# Patient Record
Sex: Male | Born: 1985 | Race: Black or African American | Hispanic: No | Marital: Single | Smoking: Former smoker
Health system: Southern US, Community
[De-identification: ages and names within clinical notes are randomized; demographics above are authoritative.]

## PROBLEM LIST (undated history)

## (undated) DIAGNOSIS — S060X9A Concussion with loss of consciousness of unspecified duration, initial encounter: Secondary | ICD-10-CM

## (undated) HISTORY — PX: NO PAST SURGERIES: SHX2092

---
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
## 2010-08-07 ENCOUNTER — Emergency Department (HOSPITAL_COMMUNITY)
Admission: EM | Admit: 2010-08-07 | Discharge: 2010-08-07 | Payer: Self-pay | Source: Home / Self Care | Admitting: Family Medicine

## 2010-10-21 LAB — POCT URINALYSIS DIPSTICK
Glucose, UA: NEGATIVE mg/dL
Hgb urine dipstick: NEGATIVE
Nitrite: NEGATIVE
Protein, ur: NEGATIVE mg/dL
Urobilinogen, UA: 1 mg/dL (ref 0.0–1.0)

## 2010-12-21 ENCOUNTER — Emergency Department (HOSPITAL_COMMUNITY)
Admission: EM | Admit: 2010-12-21 | Discharge: 2010-12-21 | Disposition: A | Discharge status: Home / Self Care | Payer: Self-pay | Attending: Emergency Medicine | Admitting: Emergency Medicine

## 2010-12-21 DIAGNOSIS — R369 Urethral discharge, unspecified: Secondary | ICD-10-CM | POA: Insufficient documentation

## 2010-12-21 DIAGNOSIS — N342 Other urethritis: Secondary | ICD-10-CM | POA: Insufficient documentation

## 2010-12-21 LAB — URINALYSIS, ROUTINE W REFLEX MICROSCOPIC
Bilirubin Urine: NEGATIVE
Glucose, UA: NEGATIVE mg/dL
Hgb urine dipstick: NEGATIVE
Ketones, ur: NEGATIVE mg/dL
Protein, ur: NEGATIVE mg/dL
Urobilinogen, UA: 0.2 mg/dL (ref 0.0–1.0)

## 2010-12-22 LAB — URINE CULTURE
Colony Count: NO GROWTH
Culture: NO GROWTH

## 2010-12-23 LAB — GC/CHLAMYDIA PROBE AMP, GENITAL
Chlamydia, DNA Probe: NEGATIVE
GC Probe Amp, Genital: NEGATIVE

## 2012-01-18 ENCOUNTER — Emergency Department (HOSPITAL_BASED_OUTPATIENT_CLINIC_OR_DEPARTMENT_OTHER)
Admission: EM | Admit: 2012-01-18 | Discharge: 2012-01-18 | Disposition: A | Discharge status: Home / Self Care | Payer: Self-pay | Attending: Emergency Medicine | Admitting: Emergency Medicine

## 2012-01-18 ENCOUNTER — Encounter (HOSPITAL_BASED_OUTPATIENT_CLINIC_OR_DEPARTMENT_OTHER): Payer: Self-pay | Admitting: Emergency Medicine

## 2012-01-18 DIAGNOSIS — T1590XA Foreign body on external eye, part unspecified, unspecified eye, initial encounter: Secondary | ICD-10-CM | POA: Insufficient documentation

## 2012-01-18 DIAGNOSIS — S058X9A Other injuries of unspecified eye and orbit, initial encounter: Secondary | ICD-10-CM | POA: Insufficient documentation

## 2012-01-18 DIAGNOSIS — S0502XA Injury of conjunctiva and corneal abrasion without foreign body, left eye, initial encounter: Secondary | ICD-10-CM

## 2012-01-18 DIAGNOSIS — H1130 Conjunctival hemorrhage, unspecified eye: Secondary | ICD-10-CM | POA: Insufficient documentation

## 2012-01-18 MED ORDER — TETRACAINE HCL 0.5 % OP SOLN
1.0000 [drp] | Freq: Once | OPHTHALMIC | Status: DC
  Filled 2012-01-18: qty 2

## 2012-01-18 MED ORDER — POLYMYXIN B-TRIMETHOPRIM 10000-0.1 UNIT/ML-% OP SOLN: 2.0000 [drp] | Freq: Four times a day (QID) | OPHTHALMIC | Status: AC

## 2012-01-18 MED ORDER — POLYMYXIN B-TRIMETHOPRIM 10000-0.1 UNIT/ML-% OP SOLN
2.0000 [drp] | Freq: Four times a day (QID) | OPHTHALMIC | Status: DC
  Administered 2012-01-18: 2 [drp] via OPHTHALMIC
  Filled 2012-01-18: qty 10

## 2012-01-18 MED ORDER — FLUORESCEIN SODIUM 1 MG OP STRP
1.0000 | ORAL_STRIP | Freq: Once | OPHTHALMIC | Status: DC
  Filled 2012-01-18: qty 1
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
## 2012-02-03 ENCOUNTER — Emergency Department (HOSPITAL_BASED_OUTPATIENT_CLINIC_OR_DEPARTMENT_OTHER)
Admission: EM | Admit: 2012-02-03 | Discharge: 2012-02-03 | Disposition: A | Discharge status: Home / Self Care | Payer: Self-pay | Attending: Emergency Medicine | Admitting: Emergency Medicine

## 2012-02-03 ENCOUNTER — Encounter (HOSPITAL_BASED_OUTPATIENT_CLINIC_OR_DEPARTMENT_OTHER): Payer: Self-pay | Admitting: Emergency Medicine

## 2012-02-03 DIAGNOSIS — W260XXA Contact with knife, initial encounter: Secondary | ICD-10-CM | POA: Insufficient documentation

## 2012-02-03 DIAGNOSIS — S61219A Laceration without foreign body of unspecified finger without damage to nail, initial encounter: Secondary | ICD-10-CM

## 2012-02-03 DIAGNOSIS — S61209A Unspecified open wound of unspecified finger without damage to nail, initial encounter: Secondary | ICD-10-CM | POA: Insufficient documentation

## 2012-02-03 MED ORDER — TETANUS-DIPHTH-ACELL PERTUSSIS 5-2.5-18.5 LF-MCG/0.5 IM SUSP
0.5000 mL | Freq: Once | INTRAMUSCULAR | Status: AC
  Administered 2012-02-03: 0.5 mL via INTRAMUSCULAR
  Filled 2012-02-03: qty 0.5
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
## 2014-11-07 ENCOUNTER — Encounter (HOSPITAL_BASED_OUTPATIENT_CLINIC_OR_DEPARTMENT_OTHER): Payer: Self-pay | Admitting: *Deleted

## 2014-11-07 ENCOUNTER — Emergency Department (HOSPITAL_BASED_OUTPATIENT_CLINIC_OR_DEPARTMENT_OTHER)
Admission: EM | Admit: 2014-11-07 | Discharge: 2014-11-07 | Disposition: A | Discharge status: Home / Self Care | Payer: Self-pay | Attending: Emergency Medicine | Admitting: Emergency Medicine

## 2014-11-07 DIAGNOSIS — R112 Nausea with vomiting, unspecified: Secondary | ICD-10-CM | POA: Insufficient documentation

## 2014-11-07 DIAGNOSIS — K0381 Cracked tooth: Secondary | ICD-10-CM | POA: Insufficient documentation

## 2014-11-07 DIAGNOSIS — K088 Other specified disorders of teeth and supporting structures: Secondary | ICD-10-CM | POA: Insufficient documentation

## 2014-11-07 DIAGNOSIS — K029 Dental caries, unspecified: Secondary | ICD-10-CM | POA: Insufficient documentation

## 2014-11-07 MED ORDER — TRAMADOL HCL 50 MG PO TABS: 50.0000 mg | ORAL_TABLET | Freq: Four times a day (QID) | ORAL | Status: DC | PRN

## 2014-11-07 MED ORDER — IBUPROFEN 600 MG PO TABS: 600.0000 mg | ORAL_TABLET | Freq: Four times a day (QID) | ORAL | Status: DC | PRN

## 2014-11-07 MED ORDER — AMOXICILLIN 500 MG PO CAPS: 500.0000 mg | ORAL_CAPSULE | Freq: Three times a day (TID) | ORAL | Status: DC

## 2014-11-07 NOTE — ED Provider Notes (Signed)
CSN: [id number]     Arrival date & time 11/07/14  1207 History   First MD Initiated Contact with Patient 11/07/14 1227     Chief Complaint  Patient presents with  . Dental Pain     (Consider location/radiation/quality/duration/timing/severity/associated sxs/prior Treatment) HPI  Pt is a 29yo male presenting to ED with c/o gradually worsening Right lower dental pain that started 3 days ago.  Pain is constant, aching and throbbing, 10/10 at worst. Denies relief with tylenol, motrin, or other "pain pill" provided by a friend's mother. Pt believes it was vicodin but states it only made him "woosy" but did not take the pain away. Reports nausea and vomiting x3 yesterday. Denies fever or chills. Pt does not have a dentist. No dental injury. No difficulty breathing or swallowing.  History reviewed. No pertinent past medical history. History reviewed. No pertinent past surgical history. No family history on file. History  Substance Use Topics  . Smoking status: Never Smoker   . Smokeless tobacco: Never Used  . Alcohol Use: No    Review of Systems  Constitutional: Negative for fever and chills.  HENT: Positive for dental problem. Negative for sore throat, trouble swallowing and voice change.   Gastrointestinal: Positive for nausea and vomiting. Negative for abdominal pain.  Musculoskeletal: Negative for neck pain and neck stiffness.  All other systems reviewed and are negative.     Allergies  Review of patient's allergies indicates no known allergies.  Home Medications   Prior to Admission medications   Medication Sig Start Date End Date Taking? Authorizing Provider  amoxicillin (AMOXIL) 500 MG capsule Take 1 capsule (500 mg total) by mouth 3 (three) times daily. 11/07/14   Junius Finner, PA-C  ibuprofen (ADVIL,MOTRIN) 600 MG tablet Take 1 tablet (600 mg total) by mouth every 6 (six) hours as needed. 11/07/14   Junius Finner, PA-C  traMADol (ULTRAM) 50 MG tablet Take 1 tablet (50 mg  total) by mouth every 6 (six) hours as needed. 11/07/14   Junius Finner, PA-C   BP 146/96 mmHg  Pulse 88  Temp(Src) 99.4 F (37.4 C) (Oral)  Resp 18  Ht  (1.778 m)  Wt 160 lb (72.576 kg)  BMI 22.96 kg/m2  SpO2 98% Physical Exam  Constitutional: He is oriented to person, place, and time. He appears well-developed and well-nourished.  HENT:  Head: Normocephalic and atraumatic.  Right Ear: Hearing, tympanic membrane, external ear and ear canal normal.  Left Ear: Hearing, tympanic membrane, external ear and ear canal normal.  Nose: Nose normal.  Mouth/Throat: Uvula is midline, oropharynx is clear and moist and mucous membranes are normal. No trismus in the jaw. Abnormal dentition. Dental caries present. No dental abscesses or uvula swelling.    Right lower wisdom tooth partially impacted with chipped second molar rubbing against buccal mucosa causing tenderness. No bleeding or discharge. No airway involvement. No gingival abscess.   Eyes: EOM are normal.  Neck: Normal range of motion. Neck supple.  Cardiovascular: Normal rate.   Pulmonary/Chest: Effort normal.  Musculoskeletal: Normal range of motion.  Neurological: He is alert and oriented to person, place, and time.  Skin: Skin is warm and dry.  Psychiatric: He has a normal mood and affect. His behavior is normal.  Nursing note and vitals reviewed.   ED Course  Procedures (including critical care time) Labs Review Labs Reviewed - No data to display  Imaging Review No results found.   EKG Interpretation None      MDM  Final diagnoses:  Pain due to dental caries  Cracked tooth    Dental pain due to dental carrie, impacted Right lower wisdom tooth and cracked Right lower second molar. No gingival abscess. Will tx with amoxicillin, tramadol and ibuprofen. Home care instructions provided. Advised to f/u with Dr. Lucky CowboyKnox, DDS, for further evaluation and treatment. Dental resource guide also provided. Return precautions  provided. Pt verbalized understanding and agreement with tx plan.    Junius Finnerrin O'Malley, PA-C 11/07/14 1316  Vanetta MuldersScott Zackowski, MD 11/13/14 2350

## 2014-11-07 NOTE — ED Notes (Signed)
Dental pain x 3 days.  

## 2014-11-07 NOTE — Discharge Instructions (Signed)
Dental Care and Dentist Visits Dental care supports good overall health. Regular dental visits can also help you avoid dental pain, bleeding, infection, and other more serious health problems in the future. It is important to keep the mouth healthy because diseases in the teeth, gums, and other oral tissues can spread to other areas of the body. Some problems, such as diabetes, heart disease, and pre-term labor have been associated with poor oral health.  See your dentist every 6 months. If you experience emergency problems such as a toothache or broken tooth, go to the dentist right away. If you see your dentist regularly, you may catch problems early. It is easier to be treated for problems in the early stages.  WHAT TO EXPECT AT A DENTIST VISIT  Your dentist will look for many common oral health problems and recommend proper treatment. At your regular dental visit, you can expect:  Gentle cleaning of the teeth and gums. This includes scraping and polishing. This helps to remove the sticky substance around the teeth and gums (plaque). Plaque forms in the mouth shortly after eating. Over time, plaque hardens on the teeth as tartar. If tartar is not removed regularly, it can cause problems. Cleaning also helps remove stains.  Periodic X-rays. These pictures of the teeth and supporting bone will help your dentist assess the health of your teeth.  Periodic fluoride treatments. Fluoride is a natural mineral shown to help strengthen teeth. Fluoride treatmentinvolves applying a fluoride gel or varnish to the teeth. It is most commonly done in children.  Examination of the mouth, tongue, jaws, teeth, and gums to look for any oral health problems, such as:  Cavities (dental caries). This is decay on the tooth caused by plaque, sugar, and acid in the mouth. It is best to catch a cavity when it is small.  Inflammation of the gums caused by plaque buildup (gingivitis).  Problems with the mouth or malformed  or misaligned teeth.  Oral cancer or other diseases of the soft tissues or jaws. KEEP YOUR TEETH AND GUMS HEALTHY For healthy teeth and gums, follow these general guidelines as well as your dentist's specific advice:  Have your teeth professionally cleaned at the dentist every 6 months.  Brush twice daily with a fluoride toothpaste.  Floss your teeth daily.  Ask your dentist if you need fluoride supplements, treatments, or fluoride toothpaste.  Eat a healthy diet. Reduce foods and drinks with added sugar.  Avoid smoking. TREATMENT FOR ORAL HEALTH PROBLEMS If you have oral health problems, treatment varies depending on the conditions present in your teeth and gums.  Your caregiver will most likely recommend good oral hygiene at each visit.  For cavities, gingivitis, or other oral health disease, your caregiver will perform a procedure to treat the problem. This is typically done at a separate appointment. Sometimes your caregiver will refer you to another dental specialist for specific tooth problems or for surgery. SEEK IMMEDIATE DENTAL CARE IF:  You have pain, bleeding, or soreness in the gum, tooth, jaw, or mouth area.  A permanent tooth becomes loose or separated from the gum socket.  You experience a blow or injury to the mouth or jaw area. Document Released: 04/09/2011 Document Revised: 10/20/2011 Document Reviewed: 04/09/2011 [facility] Patient Information 2015 [place], [place]. This information is not intended to replace advice given to you by your health care provider. Make sure you discuss any questions you have with your health care provider.  Dental Caries Dental caries is tooth decay. This  decay can cause a hole in teeth (cavity) that can get bigger and deeper over time. HOME CARE  Brush and floss your teeth. Do this at least two times a day.  Use a fluoride toothpaste.  Use a mouth rinse if told by your dentist or doctor.  Eat less sugary and starchy foods.  Drink less sugary drinks.  Avoid snacking often on sugary and starchy foods. Avoid sipping often on sugary drinks.  Keep regular checkups and cleanings with your dentist.  Use fluoride supplements if told by your dentist or doctor.  Allow fluoride to be applied to teeth if told by your dentist or doctor. Document Released: 05/06/2008 Document Revised: 12/12/2013 Document Reviewed: 07/30/2012 [facility] Patient Information 2015 [place], [place]. This information is not intended to replace advice given to you by your health care provider. Make sure you discuss any questions you have with your health care provider.  Dental Fracture You have a dental fracture or injury. This can mean the tooth is loose, has a chip in the enamel or is broken. If just the outer enamel is chipped, there is a good chance the tooth will not become infected. The only treatment needed may be to smooth off a rough edge. Fractures into the deeper layers (dentin and pulp) cause greater pain and are more likely to become infected. These require you to see a dentist as soon as possible to save the tooth. Loose teeth may need to be wired or bonded with a plastic splint to hold them in place. A paste may be painted on the open area of the broken tooth to reduce the pain. Antibiotics and pain medicine may be prescribed. Choosing a soft or liquid diet and rinsing the mouth out with warm water after meals may be helpful. See your dentist as recommended. Failure to seek care or follow up with a dentist or other specialist as recommended could result in the loss of your tooth, infection, or permanent dental problems. SEEK MEDICAL CARE IF:   You have increased pain not controlled with medicines.  You have swelling around the tooth, in the face or neck.  You have bleeding which starts, continues, or gets worse.  You have a fever. Document Released: 09/04/2004 Document Revised: 10/20/2011 Document Reviewed: 06/19/2009 [facility] Patient  Information 2015 [place], [place]. This information is not intended to replace advice given to you by your health care provider. Make sure you discuss any questions you have with your health care provider.  Dental Extraction A dental extraction procedure refers to a routine tooth extraction performed by your dentist. The procedure depends on where and how the tooth is positioned. The procedure can be very quick, sometimes lasting only seconds. Reasons for dental extraction include:  Tooth decay.  Infections (abscesses).  The need to make room for other teeth.  Gum diseases where the supporting bone has been destroyed.  Fractures of the tooth leaving it unrestorable.  Extra teeth (supernumerary) or grossly malformed teeth.  Baby teeththat have not fallen out in time and have not permitted the the permanent teeth to erupt properly.  In preparation for braces where there is not enough room to align the teeth properly.  Not enough room for wisdom teeth (particularly those that are impacted).  Prior to receiving radiation to the head and neck,teeth in the field of radiation may need to be extracted. LET YOUR CAREGIVER KNOW ABOUT:  Any allergies.  All medicines you are taking:  Including herbs, eye drops, over-the-counter medications, and creams.  Blood thinners (anticoagulants), aspirin, or other drugs that may affect blood clotting.  Use of steroids (through mouth or as creams).  Previous problems with anesthetics, including local anesthetics.  History of bleeding or blood problems.  Previous surgery.  Possibility of pregnancy if this applies.  Smoking history.  Any health problems. RISKS AND COMPLICATIONS As with any procedure, complications may occur, but they can usually be managed by your caregiver. General surgical complications may include:  Reaction to anesthesia.  Damage to surrounding teeth, nerves, tissues, or structures.  Infection.  Bleeding. With  appropriate treatment and care after surgery, the following complications are very uncommon:  Dry socket (blood clot does not form or stay in place over empty socket). This can delay healing.  Incomplete extraction of roots.  Jawbone injury, pain, or weakness. BEFORE THE PROCEDURE  Your dental care provider will:  Take a medical and dental history.  Take an X-ray to evaluate the circumstances and how to best extract the tooth.  Do an oral exam.  Depending on the situation, antibiotics may be given before or after the extraction.  Your caregivers may review the procedure, the local anesthesia and/or sedation being used, and what to expect after the procedure with you.  If needed, your dentist may give you a form of sedation, either by medicine you swallow, gas, or intravenously (IV). This will help to relieve anxiety. Complicated extractions may require the use of general anesthesia. It is important to follow your caregiver's instructions prior to your procedure to avoid complications. Steps before your procedure may include:  Alert your caregiver if you feel ill (sore throat, fever, upset stomach, etc.) in the days leading up to your procedure.  Stop taking certain medications for several days prior to your procedure such as blood thinners.  Take certain medications, such as antibiotics.  Avoid eating and drinking for several hours before the procedure. This will help you to avoid complications from the sedation or anesthesia.  Sign a patient consent form.  Have a friend or family member drive you to the dentist and drive you home after the procedure.  Wear comfortable, loose clothing. Limit makeup and jewelry.  Quit smoking. If you are a smoker, this will raise the chances of a healing problem after your procedure. If you are thinking about quitting, talk to your surgeon about how long before the operation you should stop smoking. You may also get help from your primary  caregiver. PROCEDURE Dental extraction is typically done as an outpatient procedure. IV sedation, local anesthesia, or both may be used. It will keep you comfortable and free of pain during the procedure.  There are 2 types of extractions:  Simple extraction involves a tooth that is visible in the mouth and above the gum line. After local anesthetic is given by injection, and the area is numbed, the dentist will loosen the tooth with a special instrument (elevator). Then another instrument (forceps) will be used to grasp the tooth and remove it from its socket. During the procedure you will feel some pressure, but you should not feel pain. If you do feel pain, tell your dentist. The open socket will be cleaned. Dressings (gauze) will be placed in the socket to reduce bleeding.  Surgical extractions are used if the tooth has not come into the mouth or the tooth is broken off below the gum line. The dentist will make a cut (incision) in the gum and may have to remove some of the bone around the tooth  to aid in the removal of the tooth. After removal, stitches (sutures) may be required to close the area to help in healing and control bleeding. For some surgical extractions, you may need a general anesthetic or IV sedation (through the vein). After both types of extractions, you may be given pain medication or other drugs to help healing. Other postoperative instructions will be given by your dental caregiver. AFTER THE PROCEDURE  You will have gauze in your mouth where the tooth was removed. Gentle pressure on the gauze for up to 1 hour will help to control bleeding.  A blood clot will begin to form over the open socket. This is normal. Do not touch the area or rinse it.  Your pain will be controlled with medication and self-care.  You will be given detailed instructions for care after surgery. PROGNOSIS While some discomfort is normal after tooth extraction, most patients recover fully in just a  few days. SEEK IMMEDIATE DENTAL CARE  You have uncontrolled bleeding, marked swelling, or severe pain.  You develop a fever, difficulty swallowing, or other severe symptoms.  You have questions or concerns. Document Released: 07/28/2005 Document Revised: 12/12/2013 Document Reviewed: 11/01/2010 [facility] Patient Information 2015 [place], [place]. This information is not intended to replace advice given to you by your health care provider. Make sure you discuss any questions you have with your health care provider.  Patients with Medicaid: [facility] [id number], [phone] [address], [phone]  If unable to pay, or uninsured, contact [facility] [phone]) or [facility] [phone] in [place], [phone] in [place] [facility] [place]) to become qualified for the adult dental clinic  Other Low-Cost Community Dental Services: [facility] [address], [place], [place], [id number]    [phone], Ext. [phone]    2nd and 4th Thursday of the month at 6:30am    10 clients each day by appointment, can sometimes see walk-in patients if someone does not show for an appointment [facility]- [address], [place], [place], [id number]    [phone] [facility]- [address], [place], [place], [address]    [phone]  [facility]- [phone] [facility]- [phone] [phone]- [phone]   Emergency Department Resource Guide 1) Find a Doctor and Pay Out of Pocket Although you won't have to find out who is covered by your insurance plan, it is a good idea to ask around and get recommendations. You will then need to call the office and see if the doctor you have chosen will accept you as a new patient and what types of options they offer for patients who are self-pay. Some doctors offer discounts or will set up payment plans for their patients who do not have  insurance, but you will need to ask so you aren't surprised when you get to your appointment.  2) Contact Your Local Health Department Not all health departments have doctors that can see patients for sick visits, but many do, so it is worth a call to see if yours does. If you don't know where your local health department is, you can check in your phone book. The CDC also has a tool to help you locate your state's health department, and many state websites also have listings of all of their local health departments.  3) Find a Walk-in Clinic If your illness is not likely to be very severe or complicated, you may want to try  a walk in clinic. These are popping up all over the country in pharmacies, drugstores, and shopping centers. They're usually staffed by nurse practitioners or physician assistants that have been trained to treat common illnesses and complaints. They're usually fairly quick and inexpensive. However, if you have serious medical issues or chronic medical problems, these are probably not your best option.  No Primary Care Doctor: - Call Health Connect at  [phone] - they can help you locate a primary care doctor that  accepts your insurance, provides certain services, etc. - Physician Referral Service- [phone]  Chronic Pain Problems: Organization         Address  Phone   Notes  [facility]  [phone] Patients need to be referred by their primary care doctor.   Medication Assistance: Organization         Address  Phone   Notes  [facility] [address] [place] [place], [place] [address] [phone] --Must be a resident of [facility] -- Must have NO insurance coverage whatsoever (no Medicaid/ Medicare, etc.) -- The pt. MUST have a primary care doctor that directs their care regularly and follows them in the community   [facility]  [phone]   [employer]  [phone]    Agencies that provide  inexpensive medical care: Organization         Address  Phone   Notes  [facility]  [phone]   [facility]    [phone]   [facility] [address] [place], [place] [address] [phone]   [facility] [address] [place], [place] [phone]   Planned Parenthood    [phone]   [facility]    [phone]   Community [facility] and [facility]  [address], [place] Phone:  [phone], Fax:  [phone] Hours of Operation:  9 am - 6 pm, M-F.  Also accepts Medicaid/Medicare and self-pay.  [facility]  [address], [place] Phone: [phone], Fax: [phone]. Hours of Operation:  8:30 am - 5:30 pm, M-F.  Also accepts Medicaid and self-pay.  [facility] [place] [address] [place] Phone: [phone]   [facility] [address] [place], [place] [phone], Ext. [phone] Mondays & Thursdays: 7-9 AM.  First 15 patients are seen on a first come, first serve basis.    Medicaid-accepting [facility] Providers:  Organization         Address  Phone   Notes  [facility] [address], [place] [phone] Also accepts self-pay patients.  [facility] [address] Laurell Josephs [place], [place]  [phone]   [facility] [address], [place] [phone]   [facility] [address] [place] [phone]   Renaye Rakers [address], [place]   [phone] Only accepts [place] Access [place] patients after they have their name applied to their card.   Self-Pay (no insurance) in [facility]:  Organization         Address  Phone   Notes  Sickle Cell Patients, [facility] [address] [place], [place] [phone]   [facility]gent  Care [address] [place], [place] [phone]  [facility] [place]  [address], [place] [phone]   [facility]/Dr. Osei-Bonsu  [address] [place] or [address], [place] [phone] Phone number for both [place] and [place] locations is the same.  [facility] [address], [place] [phone]   [facility] [address] [place] or [address] [phone]   [facility] [address] [place] [phone], phone; [phone], fax Sees patients 1st and 3rd Saturday of every month.  Must not qualify for public or private insurance (i.e. Medicaid, Medicare, [place] Health Choice, Veterans' Benefits)  Household income should be no more than 200% of the poverty level The clinic cannot treat you if you are pregnant or think you are pregnant  Sexually transmitted diseases are not treated at the clinic.    Dental Care: Organization         Address  Phone  Notes  [facility] [address] [place], [place] [phone] Accepts children up to age 51 who are enrolled in [place] or [place] Health Choice; pregnant women with a Medicaid card; and children who have applied for Medicaid or [place] Health Choice, but were declined, whose parents can pay a reduced fee at time of service.  [facility]  [address], [place] [phone] Accepts children up to age 37 who are enrolled in [place] or [place] Health Choice; pregnant women with a Medicaid card; and children who have applied for Medicaid or [place] Health Choice, but were declined, whose parents can pay a reduced fee at time of service.  [facility]  [address] [place], [place] [phone] Patients are seen by appointment only. Walk-ins are  not accepted. [facility] will see patients 71 years of age and older. Monday - Tuesday (8am-5pm) Most Wednesdays (8:30-5pm) $30 per visit, cash only  [facility]  [address], [facility] [phone] Patients are seen by appointment only. Walk-ins are not accepted. [facility] will see patients 28 years of age and older. One Wednesday Evening (Monthly: Volunteer Based).  $30 per visit, cash only  [employer]  [phone] for adults; Children under age 60, call Graduate Pediatric Dentistry at [phone]. Children aged 15-14, please call [phone] to request a pediatric application.  Dental services are provided in all areas of dental care including fillings, crowns and bridges, complete and partial dentures, implants, gum treatment, root canals, and extractions. Preventive care is also provided. Treatment is provided to both adults and children. Patients are selected via a lottery and there is often a waiting list.   [facility] [address] [place]  [phone] [email or web address]   [facility] [address] [place], [place] [phone], Ext. [phone] Second and Fourth Thursday of each month, opens at 6:30 AM; Clinic ends at 9 AM.  Patients are seen on a first-come first-served basis, and a limited number are seen during each clinic.   [facility]  [address] [place], [place] [phone]   Eligibility Requirements You must have lived in [place], [place], or [place] counties for at least the last three months.   You cannot be eligible for state or federal sponsored healthcare  insurance, including [employer], [place], or [employer].   You generally cannot be eligible for healthcare insurance through your employer.    How to apply: Eligibility screenings are held every Tuesday and Wednesday afternoon from 1:00 pm until 4:00 pm. You do not need an appointment for  the interview!  [facility] [address], [place], [place] [phone]   [facility]  [phone]   [facility]  [phone]   [facility]  [phone]    Behavioral Health Resources in the Community: Intensive Outpatient Programs Organization         Address  Phone  Notes  [facility] [address], [place], [place] [phone]   [facility] Outpatient [address] [place], [place] [phone]   [facility]: [facility] [address], [place], [place]  [phone]   [facility] [address]  [place], [place] [phone] or [phone]   Substance Abuse Resources Organization         Address  Phone  Notes  [facility] Drug Services  [phone]   [facility]  [phone]   [facility]  [phone]   Floydene Flock  [phone]   Residential & Outpatient Substance Abuse Program  [phone]   Psychological Services Organization         Address  Phone  Notes  [facility] Behavior[facility]  [phone]   [facility]  [phone]   [facility] Mental Health [address] [place] [phone] or [phone]    Mobile Crisis Teams Organization         Address  Phone  Notes  [facility], Mobile Crisis Care Unit  [phone]   [facility]otherapeutic Services  [address] [place], [place] [phone]   Doristine Locks [address] [place] [phone]    Self-Help/Support Groups Organization         Address  Phone             Notes  [facility] [place] - variety of support groups  336- I7437963 Call for more information  Narcotics Anonymous (NA), [facility] [address], [employer] [place]  2 meetings at this location   Statistician         Address  Phone  Notes  [facility]  [address],    [place]  [phone]   [facility]  [address] [place] [address], [place], [place] [phone]   [facility] [address] [place], [place] [phone] Admissions: 8am-3pm M-F  [facility] [address]    [place], [place] [phone]   [facility] [address] [place], [place], [place] [phone]   [facility] [address],  [place], [place] [phone]   [facility] Intensive Outpatient [address], [place], [place] [phone]   [facility] ([facility] [address] [place]  [place], [place] [phone] or [phone]   [facility] ([facility] [address] [place], [place] [phone] Accepts Medicaid  [facility] [address]  [place] [phone] Substance Abuse/Addiction Treatment   [facility] Organization         Address  Phone  Notes  [facility]  [phone]   Angie Fava, PhD [address] [place], [place]   ([phone])[phone] or (  [place]) [phone]   [facility]   [address] [place], [place] [phone]   [address], [place], [place] [phone] Insurance/Medicaid/sponsorship through [facility] [address] [place], [place] [phone] [place] [address]   [place], [place] [phone]    Dr. Adele Schilder  [phone]   [facility] Dept. 1) [address], [place] 2) [place] 3)  [address], [place] [phone]  [phone]   [place] [phone] or [phone] (After Hours)

## 2015-02-22 ENCOUNTER — Encounter (HOSPITAL_BASED_OUTPATIENT_CLINIC_OR_DEPARTMENT_OTHER): Payer: Self-pay | Admitting: *Deleted

## 2015-02-22 ENCOUNTER — Emergency Department (HOSPITAL_BASED_OUTPATIENT_CLINIC_OR_DEPARTMENT_OTHER)
Admission: EM | Admit: 2015-02-22 | Discharge: 2015-02-22 | Disposition: A | Discharge status: Home / Self Care | Payer: Self-pay | Attending: Emergency Medicine | Admitting: Emergency Medicine

## 2015-02-22 DIAGNOSIS — Z711 Person with feared health complaint in whom no diagnosis is made: Secondary | ICD-10-CM

## 2015-02-22 DIAGNOSIS — Z113 Encounter for screening for infections with a predominantly sexual mode of transmission: Secondary | ICD-10-CM | POA: Insufficient documentation

## 2015-02-22 MED ORDER — CEFTRIAXONE SODIUM 250 MG IJ SOLR
250.0000 mg | Freq: Once | INTRAMUSCULAR | Status: AC
  Administered 2015-02-22: 250 mg via INTRAMUSCULAR
  Filled 2015-02-22: qty 250

## 2015-02-22 MED ORDER — AZITHROMYCIN 250 MG PO TABS
1000.0000 mg | ORAL_TABLET | Freq: Once | ORAL | Status: AC
  Administered 2015-02-22: 1000 mg via ORAL
  Filled 2015-02-22: qty 4

## 2015-02-22 NOTE — Discharge Instructions (Signed)
°Emergency Department Resource Guide °1) Find a Doctor and Pay Out of Pocket °Although you won't have to find out who is covered by your insurance plan, it is a good idea to ask around and get recommendations. You will then need to call the office and see if the doctor you have chosen will accept you as a new patient and what types of options they offer for patients who are self-pay. Some doctors offer discounts or will set up payment plans for their patients who do not have insurance, but you will need to ask so you aren't surprised when you get to your appointment. ° °2) Contact Your Local Health Department °Not all health departments have doctors that can see patients for sick visits, but many do, so it is worth a call to see if yours does. If you don't know where your local health department is, you can check in your phone book. The CDC also has a tool to help you locate your state's health department, and many state websites also have listings of all of their local health departments. ° °3) Find a Walk-in Clinic °If your illness is not likely to be very severe or complicated, you may want to try a walk in clinic. These are popping up all over the country in pharmacies, drugstores, and shopping centers. They're usually staffed by nurse practitioners or physician assistants that have been trained to treat common illnesses and complaints. They're usually fairly quick and inexpensive. However, if you have serious medical issues or chronic medical problems, these are probably not your best option. ° °No Primary Care Doctor: °- Call Health Connect at  832-8000 - they can help you locate a primary care doctor that  accepts your insurance, provides certain services, etc. °- Physician Referral Service- 1-800-533-3463 ° °Chronic Pain Problems: °Organization         Address  Phone   Notes  °[facility]  ([phone]) [phone] Patients need to be referred by their primary care doctor.  ° °Medication  Assistance: °Organization         Address  Phone   Notes  °[place] [facility] [address] °[place], [place] [address] (336) 641-8030 --Must be a resident of [place] °-- Must have NO insurance coverage whatsoever (no Medicaid/ Medicare, etc.) °-- The pt. MUST have a primary care doctor that directs their care regularly and follows them in the community °  °[facility]  (866) 331-1348   °[employer]  (888) 892-1162   ° °Agencies that provide inexpensive medical care: °Organization         Address  Phone   Notes  °[facility]  (336) 832-8035   °[facility]    (336) 832-7272   °[facility] [address] °[place],  [address] (336) 832-4777   °[facility] [address], °[place] (336) 271-4999   °[facility]    (336) 373-0678   °[facility]    (336) 272-1050   °[facility] ° [address], [place] Phone:  (336) 832-4444, Fax:  (336) [phone] Hours of Operation:  9 am - 6 pm, M-F.  Also accepts Medicaid/Medicare and self-pay.  °[facility] ° [address], [place] Phone: (336) [phone], Fax: (336) [phone]. Hours of Operation:  8:30 am - 5:30 pm, M-F.  Also accepts Medicaid and self-pay.  °[facility] [place] [address], [place] Phone: (336) 878-6027   °[facility] [address], [place], [place] (336)723-1848, Ext. 123 Mondays & Thursdays: 7-9 AM.  First 15 patients are seen on a first come, first serve basis. °  ° °Medicaid-accepting [place] Providers: ° °Organization         Address  Phone   Notes  °[facility] [address], [place] (336) [phone] Also accepts self-pay patients.  °[facility] [address], [place] ° (336) [phone]   °[facility] [address], [place]  (336) 288-8857   °[facility] [address] [place] [address], [place] (336) 299-7000   °Veita Bland [address], [place]   ° (336) [phone] Only accepts [place] Access Medicaid patients after they have their name applied to their card.  ° °Self-Pay (no insurance) in [place]: ° °Organization         Address  Phone   Notes  °Sickle Cell Patients, [facility] [address], [place] (336) 832-1970   °[facility] [address], [place] (336) 832-4400   ° [facility] [place] ° [address], [place] (336) 992-4800   °[facility]/Dr. Osei-Bonsu ° [address] [place] [address], [place] or [address], [place] (336) [phone] Phone number for both [place] and [place] locations is the same.  °[facility] [address], [place] (336) 299-0000   °[facility] [address] [place] [address], [place] or [address] (336) 852-7530 °(336) 878-2260   °[facility] [address], [place] (336) 350-1642, phone; (336) 294-5005, fax Sees patients 1st and 3rd Saturday of every month.  Must not qualify for public or private insurance (i.e. Medicaid, Medicare, [place] Health Choice, Veterans' Benefits) • Household income should be no more than 200% of the poverty level •The clinic cannot treat you if you are pregnant or think you are pregnant • Sexually transmitted diseases are not treated at the clinic.  ° ° °Dental Care: °Organization         Address  Phone  Notes  °[place] [facility] [address], [place] (336) 641-6152 Accepts children up to age 21 who are enrolled in Medicaid or [place] Health Choice; pregnant women with a Medicaid card; and children who have applied for Medicaid or [place] Health Choice, but were declined, whose parents can pay a reduced fee at time of service.  °[place]  [facility] [place]  [address], [place] (336) 641-7733 Accepts children up to age 21 who are enrolled in Medicaid or [place] Health Choice; pregnant women with a Medicaid card; and children who have applied for Medicaid or [place] Health Choice, but were declined, whose parents can pay a reduced fee at time of service.  °[facility] ° [address],  (336) 641-4533 Patients are seen by appointment only. Walk-ins are not accepted. [facility] will see patients 18 years of age and older. °Monday - Tuesday (8am-5pm) °Most Wednesdays (8:30-5pm) °$30 per visit, cash only  °[facility] ° [address], [place] (336) 641-4533 Patients are seen by appointment only. Walk-ins are not accepted. [facility] will see patients 18 years of age and older. °One   Wednesday Evening (Monthly: Volunteer Based).  $30 per visit, cash only  °[facility]  (919) [phone] for adults; Children under age 4, call Graduate Pediatric Dentistry at ([phone]) [phone]. Children aged 4-14, please call ([phone]) [phone] to request a pediatric application. ° Dental services are provided in all areas of dental care including fillings, crowns and bridges, complete and partial dentures, implants, gum treatment, root canals, and extractions. Preventive care is also provided. Treatment is provided to both adults and children. °Patients are selected via a lottery and there is often a waiting list. °  °[facility] [address], °[place] ° (336) 763-8833 [email or web address] °  °[facility] [address], [place], [place] (336)723-1848, Ext. 123 Second and Fourth Thursday of each month, opens at 6:30 AM; Clinic ends at 9 AM.  Patients are seen on a first-come first-served basis, and a limited number are seen during each clinic.  ° °[facility] ° [address], [place], [place] (336) 723-7904    Eligibility Requirements °You must have lived in [place], or [place] counties for at least the last three months. °  You cannot be eligible for state or federal sponsored healthcare insurance, including Veterans Administration, Medicaid, or Medicare. °  You generally cannot be eligible for healthcare insurance through your employer.  °  How to apply: °Eligibility screenings are held every Tuesday and Wednesday afternoon from 1:00 pm until 4:00 pm. You do not need an appointment for the interview!  °[facility] [address], [place], [place] 336-631-2330   °[place] [facility]  336-342-8273   °[place]  336-703-3100   °[facility]  336-570-6415   ° °Behavioral Health Resources in the Community: °Intensive Outpatient Programs °Organization         Address  Phone  Notes  °[facility] [address], [place], [place] 336-878-6098   °[facility] [address], [place], [place] 336-832-9800   °[facility] [address], [place], [place] ° 336-882-2125   °[facility] [address],  °[place], [place] 1-800-853-5163 or 336-641-4981   °Substance Abuse Resources °Organization         Address  Phone  Notes  °[facility]  336-882-2125   °[facility]  336-784-9470   °[facility]  336-285-9073   °[facility]  336-845-3988   °Residential & Outpatient Substance Abuse Program  1-800-659-3381   °Psychological Services °Organization         Address  Phone  Notes  °[facility]  336- 832-9600   °[facility]  336- 378-7881   °[facility] [address], [place] 1-800-853-5163 or 336-641-4981   ° °Mobile Crisis Teams °Organization         Address  Phone  Notes  °[facility], Mobile Crisis Care Unit  1-877-626-1772   °Assertive °[facility] ° [address]  [place], [place] 336-834-9664   °Sharon DeEsch [address] °[place]  336-554-5454   ° °Self-Help/Support Groups °Organization         Address  Phone             Notes  °[facility] [place] - variety of support groups  336- 373-1402 Call for more information  °Narcotics Anonymous (NA), [facility] [address], °[place]   2 meetings at this location  ° °  Residential Treatment Programs °Organization         Address  Phone  Notes  °[facility] [address],    °[place]  1-866-801-8205   °[facility] ° [address], [place], [place] 704-293-8524   °[facility] [address], [place] 336-845-3988 Admissions: 8am-3pm M-F  °[facility] [address]    °[place], [place] 336-841-1104   °[facility] [address], [place], [place] 336-379-7146   °[facility] [address]  °[place], [place] 336-285-9073   °[facility] Intensive Outpatient [address], [place], [place] 336-852-3033   °[facility] ([facility] [address]  °[place], [place] 1-877-615-2722 or 336-784-9470   °[facility] ([facility]) [address] [place], [place] 336-227-7417 Accepts Medicaid  °[facility] [address]  ° [place] 1-800-659-3381 Substance Abuse/Addiction Treatment  ° °[place] Behavioral Health Resources °Organization         Address  Phone  Notes  °[facility]  (888) 581-9988   °Julie Brannon, PhD [address] [place], [place]   (336) 349-5553 or (336) 951-0000   °[facility]   [address] °[place], [place] (336) 349-4454   °[facility] [address], [place], [place] (336) 342-8316 Insurance/Medicaid/sponsorship through [facility]  °[facility] [address]                                    [place], [place] (336) 342-8316 Therapy/tele-psych/case    °[facility] [address]  ° [place], [place] (336) 349-2233    °Dr. Arfeen  (336) 349-4544   °[facility] [place]  [employer] [place] [facility] 1) [address], [place] °2) [address], [place] °3)  [address], [place] (336) 349-3220 °(336) 342-7768 ° °(336) 342-8140   °[place] [facility] (336) 342-1394 or (336) 342-3537 (After Hours)    ° ° °

## 2015-02-22 NOTE — ED Provider Notes (Signed)
CSN: [id number]     Arrival date & time 02/22/15  1002 History   First MD Initiated Contact with Patient 02/22/15 1012     Chief Complaint  Patient presents with  . Exposure to STD     (Consider location/radiation/quality/duration/timing/severity/associated sxs/prior Treatment) HPI Comments: Girlfriend is getting treated for PID. He wants treatment too.  Patient is a 29 y.o. male presenting with STD exposure. The history is provided by the patient.  Exposure to STD This is a new problem. The current episode started more than 2 days ago. The problem occurs constantly. The problem has not changed since onset.Pertinent negatives include no chest pain, no abdominal pain and no shortness of breath. Nothing aggravates the symptoms. Nothing relieves the symptoms.    History reviewed. No pertinent past medical history. History reviewed. No pertinent past surgical history. History reviewed. No pertinent family history. History  Substance Use Topics  . Smoking status: Never Smoker   . Smokeless tobacco: Never Used  . Alcohol Use: No    Review of Systems  Constitutional: Negative for fever.  Respiratory: Negative for cough and shortness of breath.   Cardiovascular: Negative for chest pain.  Gastrointestinal: Negative for abdominal pain.  All other systems reviewed and are negative.     Allergies  Review of patient's allergies indicates no known allergies.  Home Medications   Prior to Admission medications   Not on File   BP 115/78 mmHg  Pulse 68  Temp(Src) 98.5 F (36.9 C) (Oral)  Resp 16  Ht 5\' 10"  (1.778 m)  Wt 167 lb (75.751 kg)  BMI 23.96 kg/m2  SpO2 100% Physical Exam  Constitutional: He is oriented to person, place, and time. He appears well-developed and well-nourished. No distress.  HENT:  Head: Normocephalic and atraumatic.  Mouth/Throat: Oropharynx is clear and moist. No oropharyngeal exudate.  Eyes: EOM are normal. Pupils are equal, round, and reactive to  light.  Neck: Normal range of motion. Neck supple.  Cardiovascular: Normal rate and regular rhythm.  Exam reveals no friction rub.   No murmur heard. Pulmonary/Chest: Effort normal and breath sounds normal. No respiratory distress. He has no wheezes. He has no rales.  Abdominal: Soft. He exhibits no distension. There is no tenderness. There is no rebound.  Musculoskeletal: Normal range of motion. He exhibits no edema.  Neurological: He is alert and oriented to person, place, and time.  Skin: No rash noted. He is not diaphoretic.  Nursing note and vitals reviewed.   ED Course  Procedures (including critical care time) Labs Review Labs Reviewed - No data to display  Imaging Review No results found.   EKG Interpretation None      MDM   Final diagnoses:  Concern about STD in male without diagnosis    61M here for STD treatment. His girlfriend is here in the ED getting treatment for PID. He wants treatment for STDs. No symptoms at this time.    Elwin MochaBlair Batoul Limes, MD 02/22/15 [phone]

## 2015-02-22 NOTE — ED Notes (Signed)
Pt amb to room 10 with quick steady gait in nad. Pt requests std testing and treatment, his gf is here with him for same. Denies any symptoms or c/o.

## 2015-03-26 ENCOUNTER — Encounter (HOSPITAL_BASED_OUTPATIENT_CLINIC_OR_DEPARTMENT_OTHER): Payer: Self-pay | Admitting: *Deleted

## 2015-03-26 ENCOUNTER — Emergency Department (HOSPITAL_BASED_OUTPATIENT_CLINIC_OR_DEPARTMENT_OTHER)
Admission: EM | Admit: 2015-03-26 | Discharge: 2015-03-26 | Disposition: A | Discharge status: Home / Self Care | Payer: Self-pay | Attending: Emergency Medicine | Admitting: Emergency Medicine

## 2015-03-26 DIAGNOSIS — Z202 Contact with and (suspected) exposure to infections with a predominantly sexual mode of transmission: Secondary | ICD-10-CM | POA: Insufficient documentation

## 2015-03-26 LAB — URINALYSIS, ROUTINE W REFLEX MICROSCOPIC
Bilirubin Urine: NEGATIVE
GLUCOSE, UA: NEGATIVE mg/dL
HGB URINE DIPSTICK: NEGATIVE
Ketones, ur: NEGATIVE mg/dL
LEUKOCYTES UA: NEGATIVE
Nitrite: NEGATIVE
PROTEIN: NEGATIVE mg/dL
SPECIFIC GRAVITY, URINE: 1.02 (ref 1.005–1.030)
Urobilinogen, UA: 1 mg/dL (ref 0.0–1.0)
pH: 6.5 (ref 5.0–8.0)

## 2015-03-26 MED ORDER — CEFTRIAXONE SODIUM 250 MG IJ SOLR
250.0000 mg | Freq: Once | INTRAMUSCULAR | Status: AC
  Administered 2015-03-26: 250 mg via INTRAMUSCULAR
  Filled 2015-03-26: qty 250

## 2015-03-26 MED ORDER — AZITHROMYCIN 250 MG PO TABS
1000.0000 mg | ORAL_TABLET | Freq: Once | ORAL | Status: AC
  Administered 2015-03-26: 1000 mg via ORAL
  Filled 2015-03-26: qty 4

## 2015-03-26 MED ORDER — METRONIDAZOLE 500 MG PO TABS: 500.0000 mg | ORAL_TABLET | Freq: Once | ORAL | Status: DC

## 2015-03-26 NOTE — Discharge Instructions (Signed)
Safe Sex Call any of the numbers on the resource guide to get a primary care physician Safe sex is about reducing the risk of giving or getting a sexually transmitted disease (STD). STDs are spread through sexual contact involving the genitals, mouth, or rectum. Some STDs can be cured and others cannot. Safe sex can also prevent unintended pregnancies.  WHAT ARE SOME SAFE SEX PRACTICES?  Limit your sexual activity to only one partner who is having sex with only you.  Talk to your partner about his or her past partners, past STDs, and drug use.  Use a condom every time you have sexual intercourse. This includes vaginal, oral, and anal sexual activity. Both females and males should wear condoms during oral sex. Only use latex or polyurethane condoms and water-based lubricants. Using petroleum-based lubricants or oils to lubricate a condom will weaken the condom and increase the chance that it will break. The condom should be in place from the beginning to the end of sexual activity. Wearing a condom reduces, but does not completely eliminate, your risk of getting or giving an STD. STDs can be spread by contact with infected body fluids and skin.  Get vaccinated for hepatitis B and HPV.  Avoid alcohol and recreational drugs, which can affect your judgment. You may forget to use a condom or participate in high-risk sex.  For females, avoid douching after sexual intercourse. Douching can spread an infection farther into the reproductive tract.  Check your body for signs of sores, blisters, rashes, or unusual discharge. See your health care provider if you notice any of these signs.  Avoid sexual contact if you have symptoms of an infection or are being treated for an STD. If you or your partner has herpes, avoid sexual contact when blisters are present. Use condoms at all other times.  If you are at risk of being infected with HIV, it is recommended that you take a prescription medicine daily to  prevent HIV infection. This is called pre-exposure prophylaxis (PrEP). You are considered at risk if:  You are a man who has sex with other men (MSM).  You are a heterosexual man or woman who is sexually active with more than one partner.  You take drugs by injection.  You are sexually active with a partner who has HIV.  Talk with your health care provider about whether you are at high risk of being infected with HIV. If you choose to begin PrEP, you should first be tested for HIV. You should then be tested every 3 months for as long as you are taking PrEP.  See your health care provider for regular screenings, exams, and tests for other STDs. Before having sex with a new partner, each of you should be screened for STDs and should talk about the results with each other. WHAT ARE THE BENEFITS OF SAFE SEX?   There is less chance of getting or giving an STD.  You can prevent unwanted or unintended pregnancies.  By discussing safe sex concerns with your partner, you may increase feelings of intimacy, comfort, trust, and honesty between the two of you. Document Released: 09/04/2004 Document Revised: 12/12/2013 Document Reviewed: 01/19/2012 [facility] Patient Information 2015 [place], [place]. This information is not intended to replace advice given to you by your health care provider. Make sure you discuss any questions you have with your health care provider.  Emergency Department Resource Guide 1) Find a Doctor and Pay Out of Pocket Although you won't have to find out  who is covered by your insurance plan, it is a good idea to ask around and get recommendations. You will then need to call the office and see if the doctor you have chosen will accept you as a new patient and what types of options they offer for patients who are self-pay. Some doctors offer discounts or will set up payment plans for their patients who do not have insurance, but you will need to ask so you aren't surprised when you  get to your appointment.  2) Contact Your Local Health Department Not all health departments have doctors that can see patients for sick visits, but many do, so it is worth a call to see if yours does. If you don't know where your local health department is, you can check in your phone book. The CDC also has a tool to help you locate your state's health department, and many state websites also have listings of all of their local health departments.  3) Find a Walk-in Clinic If your illness is not likely to be very severe or complicated, you may want to try a walk in clinic. These are popping up all over the country in pharmacies, drugstores, and shopping centers. They're usually staffed by nurse practitioners or physician assistants that have been trained to treat common illnesses and complaints. They're usually fairly quick and inexpensive. However, if you have serious medical issues or chronic medical problems, these are probably not your best option.  No Primary Care Doctor: - Call Health Connect at  [phone] - they can help you locate a primary care doctor that  accepts your insurance, provides certain services, etc. - Physician Referral Service- [phone]  Chronic Pain Problems: Organization         Address  Phone   Notes  [facility]  [phone] Patients need to be referred by their primary care doctor.   Medication Assistance: Organization         Address  Phone   Notes  [facility] Medication [facility] [address] [place] [place], [place] [address] [phone] --Must be a resident of [facility] -- Must have NO insurance coverage whatsoever (no Medicaid/ Medicare, etc.) -- The pt. MUST have a primary care doctor that directs their care regularly and follows them in the community   [facility]ist  [phone]   [employer]  [phone]    Agencies that provide inexpensive medical care: Organization         Address  Phone    Notes  [facility]  [phone]   [facility]    [phone]   [facility] [address] [place], [place] [address] [phone]   [facility] [address] [place], [place] [phone]   Planned Parenthood    [phone]   [facility]    [phone]   [facility]  [address], [place] Phone:  [phone], Fax:  [phone] Hours of Operation:  9 am - 6 pm, M-F.  Also accepts Medicaid/Medicare and self-pay.  [facility]  [address], [place] Phone: [phone], Fax: [phone]. Hours of Operation:  8:30 am - 5:30 pm, M-F.  Also accepts Medicaid and self-pay.  [facility] [place] [address] [employer] Phone: [phone]   [facility] [address], [place], [place] (  [phone], Ext. [phone] Mondays & Thursdays: 7-9 AM.  First 15 patients are seen on a first come, first serve basis.    Medicaid-accepting [facility] Providers:  Organization         Address  Phone   Notes  [facility] [address], [place] [phone] Also accepts self-pay patients.  [facility] [address] Laurell Josephs [place], [place]  [phone]   [facility] [address], [place] [phone]   [facility] [address] [place] [phone]   Renaye Rakers [address], [place]   [phone] Only accepts [place] Access [place] patients after they have their name applied to their card.   Self-Pay (no insurance) in [facility]:  Organization         Address  Phone   Notes  Sickle Cell Patients, [facility] Medicine [address] [place], [place] [phone]   [facility] Care [address] [place], [place] [phone]   [facility] [place]  [address], [place] [phone]   [facility]/Dr. Osei-Bonsu  [address], [place] or [address], [place] [phone] Phone number for both [place] and [place] locations is the same.  [facility] [address] [place] [phone]   [facility] [address] [place] or [address] [phone]   [facility] [address] [place] [phone], phone; [phone], fax Sees patients 1st and 3rd Saturday of every month.  Must not qualify for public or private insurance (i.e. Medicaid, Medicare, [place] Health Choice, Veterans' Benefits)  Household income should be no more than 200% of the poverty level The clinic cannot treat you if you are pregnant or think you are pregnant  Sexually transmitted diseases are not treated at the clinic.    Dental Care: Organization         Address  Phone  Notes  [facility] [address] [place], [place] [phone] Accepts children up to age 36 who are enrolled in [place] or [place] Health Choice; pregnant women with a Medicaid card; and children who have applied for Medicaid or [place] Health Choice, but were declined, whose parents can pay a reduced fee at time of service.  [facility] Department of [facility]  [address], [place] [phone] Accepts children up to age 44 who are enrolled in [place] or [place] Health Choice; pregnant women with a Medicaid card; and children who have applied for Medicaid or [place] Health Choice, but were declined, whose parents can pay a reduced fee at time of service.  [facility]  [address] [place], [place] [phone] Patients are seen by appointment only. Walk-ins are not accepted. [facility] will see patients 17 years of age  and older. Monday - Tuesday (8am-5pm) Most Wednesdays (8:30-5pm) $30 per visit, cash only  [facility]  [address], [facility] [phone] Patients are seen by appointment only. Walk-ins are not accepted. [facility] will see patients 96 years of age and older. One Wednesday Evening (Monthly: Volunteer Based).  $30 per visit, cash only  [employer]  [phone])  [phone] for adults; Children under age 1, call Graduate Pediatric Dentistry at [phone]. Children aged 80-14, please call [phone] to request a pediatric application.  Dental services are provided in all areas of dental care including fillings, crowns and bridges, complete and partial dentures, implants, gum treatment, root canals, and extractions. Preventive care is also provided. Treatment is provided to both adults and children. Patients are selected via a lottery and there is often a waiting list.   [facility] [address] [place]  [phone] [email or web address]   [facility] [address] [place], [place] [phone], Ext. [phone] Second and Fourth Thursday of each month, opens at 6:30 AM; Clinic ends at 9 AM.  Patients are seen on a first-come first-served basis, and a limited number are seen during each clinic.   [facility]  [address] Ether Griffins [place], [place] [phone]   Eligibility Requirements You must have lived in [place], [place], or [place] counties for at least the last three months.   You cannot be eligible for state or federal sponsored [place], including [employer], [place], or [employer].   You generally cannot be eligible for healthcare insurance through your employer.    How to apply: Eligibility screenings are held every Tuesday and Wednesday afternoon from 1:00 pm until 4:00 pm. You do not need an appointment for the interview!  [facility] [address], [place], [place] [phone]   [facility] Health Department  [phone]   [facility] Health Department  [phone]   [facility] Health Department  [phone]    Behavioral Health Resources in the Community: Intensive Outpatient Programs Organization         Address  Phone  Notes  [facility] Services [address] [place], [place] [phone]   [facility] [place] [facility] Outpatient [address] [place], [place] [phone]   [facility] [address] [place], [place]  [phone]   [facility] Mental Health [address]  [place], [place] [phone] or [phone]   Substance Abuse Resources Organization         Address  Phone  Notes  [facility]  [phone]   [facility]  [phone]   [facility] [place]  [phone]   Floydene Flock  [phone]   Residential & Outpatient Substance Abuse Program  [phone]   Psychological Services Organization         Address  Phone  Notes  [facility] Behavioral Health  [phone]   [facility]  [phone]   [facility] Mental Health [address] [place] [phone] or [phone]    Mobile Crisis Teams Organization         Address  Phone  Notes  [facility], Mobile Crisis Care Unit  [phone]   [facility]  [address] [place], [place] [phone]   Doristine Locks [address] [place] [phone]    Self-Help/Support Groups Organization         Address  Phone             Notes  [facility] [place] - variety of support groups  336- I7437963 Call for more information  Narcotics Anonymous (NA), [facility] [address], [employer] [place]  2 meetings at this location   Chief Executive Officer  Notes  [facility] [address],    [place]  [phone]   [facility]  [address] [place] [address], [place], [place] [phone]   [facility] [address] [place], [place] [phone] Admissions: 8am-3pm M-F  [facility] [address]    [place], [place] [phone]   [facility] [address] [place], [place], [place] [phone]   [facility] [address],  [place], [place] [phone]   [facility] - Intensive Outpatient [address]ure[address], [place], [place] [phone]   [facility] ([facility]iction [facility] [address] [place]  [place], [place] [phone] or [phone]   [facility] ([facility]) [address], [place], [place] [phone] Accepts Medicaid  [facility] [address],  [place] [phone] Substance Abuse/Addiction Treatment   [facility] Organization         Address  Phone  Notes  [facility]  [phone]   Angie Fava, PhD [address] Ervin Knack [place], [place]   [phone] or [phone]   [facility]al   [address] [place], [place] [phone]   [facility] [address] [place], [place] [phone] Insurance/Medicaid/sponsorship through [place] and Families [address]                                    [place], [place] [phone] Therapy/tele-psych/case  [facility] [address], [place] [phone]    Dr. Lolly Mustache  [phone]   [facility]  [employer] [facility] Dept. 1) [address], [place] 2) [address] [place] 3)  [address], [place] [phone]  [phone]   [facility] Child Abuse Hotline 4121786940 or [phone] (After Hours)

## 2015-03-26 NOTE — ED Notes (Addendum)
STD exposure. No symptoms. States when his sexual partner told him she has tric he got off work and came straight here.

## 2015-03-26 NOTE — ED Provider Notes (Signed)
CSN: [id number]     Arrival date & time 03/26/15  1144 History   First MD Initiated Contact with Patient 03/26/15 1155     Chief Complaint  Patient presents with  . SEXUALLY TRANSMITTED DISEASE     (Consider location/radiation/quality/duration/timing/severity/associated sxs/prior Treatment) HPI Patient had sexual intercourse with a male 2 nights ago who later told him that she had Trichomonas. He presents to be checked for sexually transmitted diseases. No treatment prior to coming here. He is asymptomatic. History reviewed. No pertinent past medical history. History reviewed. No pertinent past surgical history. No family history on file. Social History  Substance Use Topics  . Smoking status: Never Smoker   . Smokeless tobacco: Never Used  . Alcohol Use: No    Review of Systems  Constitutional: Negative.   HENT: Negative.   Respiratory: Negative.   Cardiovascular: Negative.   Gastrointestinal: Negative.   Musculoskeletal: Negative.   Skin: Negative.   Allergic/Immunologic: Negative.   Neurological: Negative.   Psychiatric/Behavioral: Negative.       Allergies  Review of patient's allergies indicates no known allergies.  Home Medications   Prior to Admission medications   Not on File   BP 113/59 mmHg  Pulse 62  Temp(Src) 98.3 F (36.8 C) (Oral)  Resp 20  Ht  (1.778 m)  Wt 167 lb (75.751 kg)  BMI 23.96 kg/m2  SpO2 98% Physical Exam  Constitutional: He appears well-developed and well-nourished.  HENT:  Head: Normocephalic and atraumatic.  Eyes: Conjunctivae are normal. Pupils are equal, round, and reactive to light.  Neck: Neck supple. No tracheal deviation present. No thyromegaly present.  Cardiovascular: Normal rate and regular rhythm.   No murmur heard. Pulmonary/Chest: Effort normal and breath sounds normal.  Abdominal: Soft. Bowel sounds are normal. He exhibits no distension. There is no tenderness.  Genitourinary: Penis normal.  No urethral  discharge  Musculoskeletal: Normal range of motion. He exhibits no edema or tenderness.  Neurological: He is alert. Coordination normal.  Skin: Skin is warm and dry. No rash noted.  Psychiatric: He has a normal mood and affect.  Nursing note and vitals reviewed.   ED Course  Procedures (including critical care time) Labs Review Labs Reviewed  URINALYSIS, ROUTINE W REFLEX MICROSCOPIC (NOT AT [facility])    Imaging Review No results found. I, Doug Sou, personally reviewed and evaluated these images and lab results as part of my medical decision-making.   EKG Interpretation None      MDM  HIV RPR GC Chlamydia cultures pending. We'll treat empirically for STDs. Prescription Flagyl. Safe sex encouraged. Referral resource guide to get primary care physician Final diagnoses:  None   Diagnosis STD exposure     Doug Sou, MD 03/26/15 1216

## 2015-03-26 NOTE — ED Notes (Signed)
MD at bedside. 

## 2015-03-27 LAB — HIV ANTIBODY (ROUTINE TESTING W REFLEX): HIV Screen 4th Generation wRfx: NONREACTIVE

## 2015-03-27 LAB — RPR: RPR Ser Ql: NONREACTIVE

## 2015-04-23 ENCOUNTER — Emergency Department (HOSPITAL_BASED_OUTPATIENT_CLINIC_OR_DEPARTMENT_OTHER)
Admission: EM | Admit: 2015-04-23 | Discharge: 2015-04-23 | Disposition: A | Discharge status: Home / Self Care | Payer: Self-pay | Attending: Emergency Medicine | Admitting: Emergency Medicine

## 2015-04-23 ENCOUNTER — Encounter (HOSPITAL_BASED_OUTPATIENT_CLINIC_OR_DEPARTMENT_OTHER): Payer: Self-pay | Admitting: *Deleted

## 2015-04-23 DIAGNOSIS — K002 Abnormalities of size and form of teeth: Secondary | ICD-10-CM | POA: Insufficient documentation

## 2015-04-23 DIAGNOSIS — K088 Other specified disorders of teeth and supporting structures: Secondary | ICD-10-CM | POA: Insufficient documentation

## 2015-04-23 DIAGNOSIS — K0381 Cracked tooth: Secondary | ICD-10-CM | POA: Insufficient documentation

## 2015-04-23 DIAGNOSIS — K029 Dental caries, unspecified: Secondary | ICD-10-CM | POA: Insufficient documentation

## 2015-04-23 MED ORDER — OXYCODONE-ACETAMINOPHEN 5-325 MG PO TABS: 2.0000 | ORAL_TABLET | ORAL | Status: DC | PRN

## 2015-04-23 MED ORDER — OXYCODONE-ACETAMINOPHEN 5-325 MG PO TABS
2.0000 | ORAL_TABLET | Freq: Once | ORAL | Status: AC
  Administered 2015-04-23: 2 via ORAL
  Filled 2015-04-23: qty 2

## 2015-04-23 NOTE — ED Notes (Signed)
Snack given with pain meds. Pt's ride is on the way to pick him up

## 2015-04-23 NOTE — ED Notes (Addendum)
Pt reports right lower tooth pain x 1 year- has impacted wisdom tooth and cracked molar- pain worse since Friday- reports he has been taking ibuprofen, tramadol and amoxicillin from an old Rx since Friday with minimal relief

## 2015-04-23 NOTE — ED Provider Notes (Signed)
CSN: [id number]     Arrival date & time 04/23/15  0911 History   First MD Initiated Contact with Patient 04/23/15 [phone]     Chief Complaint  Patient presents with  . Dental Pain     (Consider location/radiation/quality/duration/timing/severity/associated sxs/prior Treatment) Patient is a 29 y.o. male presenting with tooth pain. The history is provided by the patient. No language interpreter was used.  Dental Pain Associated symptoms: no drooling and no fever   George Thompson is a 29 y.o with a history of dental caries who presents for dental pain that has been intermittent for the past year but worse in the last 4 days after sneezing. He states that he has had a chronically fractured right bottom tooth but has not seen a dentist. He states he will have medical insurance in the next couple of weeks and plans to see a dentist to have his tooth removed. He has been taking amoxicillin, tramadol, and ibuprofen with minimal relief. He states these medications were given to him last time he was in the ED so he thought that they would work this time. He denies any fever, chills, facial swelling, difficulty swallowing, shortness of breath or difficulty breathing.  History reviewed. No pertinent past medical history. History reviewed. No pertinent past surgical history. No family history on file. Social History  Substance Use Topics  . Smoking status: Never Smoker   . Smokeless tobacco: Never Used  . Alcohol Use: Yes     Comment: weekends    Review of Systems  Constitutional: Negative for fever and chills.  HENT: Positive for dental problem. Negative for drooling.   Gastrointestinal: Negative for nausea and vomiting.  All other systems reviewed and are negative.     Allergies  Review of patient's allergies indicates no known allergies.  Home Medications   Prior to Admission medications   Medication Sig Start Date End Date Taking? Authorizing Provider  metroNIDAZOLE (FLAGYL) 500 MG tablet  Take 1 tablet (500 mg total) by mouth once. Take 4 tablets one time. 03/26/15   George Sou, MD  oxyCODONE-acetaminophen (PERCOCET/ROXICET) 5-325 MG per tablet Take 2 tablets by mouth every 4 (four) hours as needed for severe pain. 04/23/15   George Kuhnert Patel-Mills, PA-C   BP 118/85 mmHg  Pulse 94  Temp(Src) 98.5 F (36.9 C) (Oral)  Resp 16  Ht 5\' 10"  (1.778 m)  Wt 167 lb (75.751 kg)  BMI 23.96 kg/m2  SpO2 100% Physical Exam  Constitutional: He is oriented to person, place, and time. He appears well-developed and well-nourished.  HENT:  Head: Normocephalic and atraumatic.  Mouth/Throat: Uvula is midline, oropharynx is clear and moist and mucous membranes are normal. No trismus in the jaw. Abnormal dentition. Dental caries present. No dental abscesses.    Abnormal dentition with tenderness to the tooth as diagrammed.  No gum swelling or fluctuance of the tooth.  No facial or anterior neck swelling. No trismus or drooling.   Eyes: Conjunctivae are normal.  Neck: Normal range of motion. Neck supple.  Cardiovascular: Normal rate.   Pulmonary/Chest: Effort normal. No respiratory distress.  Musculoskeletal: Normal range of motion.  Neurological: He is alert and oriented to person, place, and time.  Skin: Skin is warm and dry.  Psychiatric: He has a normal mood and affect. His behavior is normal.  Nursing note and vitals reviewed.   ED Course  Procedures (including critical care time) Labs Review Labs Reviewed - No data to display  Imaging Review No results found.  EKG  Interpretation None      MDM   Final diagnoses:  Pain due to dental caries  Patient presents for chronic dental pain and old fractured tooth. He is afebrile and I do not suspect dental abscess or signs of infection such as Ludwigs angina. I have given him dental follow up and percocet for pain control.  I discussed return precautions with the patient and he verbally agrees with the plan. He was given a work  note. Medications  oxyCODONE-acetaminophen (PERCOCET/ROXICET) 5-325 MG per tablet 2 tablet (2 tablets Oral Given 04/23/15 1005)  Rx: Percocet     George Gosselin, PA-C 04/23/15 [address] PA-C 04/23/15 1339  George Porter, MD 04/28/15 [phone]

## 2015-04-23 NOTE — Discharge Instructions (Signed)
Dental Caries Follow-up with a dentist. Return for fever or facial swelling. Dental caries is tooth decay. This decay can cause a hole in teeth (cavity) that can get bigger and deeper over time. HOME CARE  Brush and floss your teeth. Do this at least two times a day.  Use a fluoride toothpaste.  Use a mouth rinse if told by your dentist or doctor.  Eat less sugary and starchy foods. Drink less sugary drinks.  Avoid snacking often on sugary and starchy foods. Avoid sipping often on sugary drinks.  Keep regular checkups and cleanings with your dentist.  Use fluoride supplements if told by your dentist or doctor.  Allow fluoride to be applied to teeth if told by your dentist or doctor. Document Released: 05/06/2008 Document Revised: 12/12/2013 Document Reviewed: 07/30/2012 [employer] Patient Information 2015 [place], [place]. This information is not intended to replace advice given to you by your health care provider. Make sure you discuss any questions you have with your health care provider.

## 2015-04-23 NOTE — ED Notes (Addendum)
Pt states gf is driving him home. Directed to pharmacy to pick up prescriptions

## 2016-05-29 ENCOUNTER — Encounter (HOSPITAL_COMMUNITY): Payer: Self-pay | Admitting: Emergency Medicine

## 2016-05-29 ENCOUNTER — Ambulatory Visit (HOSPITAL_COMMUNITY)
Admission: EM | Admit: 2016-05-29 | Discharge: 2016-05-29 | Disposition: A | Discharge status: Home / Self Care | Payer: Self-pay | Attending: Family Medicine | Admitting: Family Medicine

## 2016-05-29 DIAGNOSIS — Z202 Contact with and (suspected) exposure to infections with a predominantly sexual mode of transmission: Secondary | ICD-10-CM

## 2016-05-29 DIAGNOSIS — Z711 Person with feared health complaint in whom no diagnosis is made: Secondary | ICD-10-CM

## 2016-05-29 DIAGNOSIS — Z79899 Other long term (current) drug therapy: Secondary | ICD-10-CM | POA: Insufficient documentation

## 2016-05-29 LAB — POCT URINALYSIS DIP (DEVICE)
BILIRUBIN URINE: NEGATIVE
GLUCOSE, UA: NEGATIVE mg/dL
Hgb urine dipstick: NEGATIVE
Ketones, ur: NEGATIVE mg/dL
LEUKOCYTES UA: NEGATIVE
NITRITE: NEGATIVE
Protein, ur: NEGATIVE mg/dL
Specific Gravity, Urine: 1.02 (ref 1.005–1.030)
UROBILINOGEN UA: 0.2 mg/dL (ref 0.0–1.0)
pH: 6.5 (ref 5.0–8.0)

## 2016-05-29 MED ORDER — METRONIDAZOLE 500 MG PO TABS: 500.0000 mg | ORAL_TABLET | Freq: Two times a day (BID) | ORAL | 0 refills | Status: DC

## 2016-05-29 MED ORDER — LIDOCAINE HCL (PF) 1 % IJ SOLN
INTRAMUSCULAR | Status: AC
  Filled 2016-05-29: qty 2

## 2016-05-29 MED ORDER — CEFTRIAXONE SODIUM 250 MG IJ SOLR
250.0000 mg | Freq: Once | INTRAMUSCULAR | Status: AC
  Administered 2016-05-29: 250 mg via INTRAMUSCULAR

## 2016-05-29 MED ORDER — AZITHROMYCIN 250 MG PO TABS
ORAL_TABLET | ORAL | Status: AC
  Filled 2016-05-29: qty 4

## 2016-05-29 MED ORDER — AZITHROMYCIN 250 MG PO TABS
1000.0000 mg | ORAL_TABLET | Freq: Once | ORAL | Status: AC
  Administered 2016-05-29: 1000 mg via ORAL

## 2016-05-29 MED ORDER — CEFTRIAXONE SODIUM 250 MG IJ SOLR
INTRAMUSCULAR | Status: AC
  Filled 2016-05-29: qty 250

## 2016-05-29 NOTE — ED Provider Notes (Signed)
[facility]    CSN: [id number] Arrival date & time: 05/29/16  1546     History   Chief Complaint Chief Complaint  Patient presents with  . Exposure to STD    HPI George Thompson is a 30 y.o. male.   The history is provided by the patient.  Exposure to STD  This is a new problem. The current episode started 3 to 5 hours ago (told by girl this am that her doctor told her for me to get checked, pt denies sx.). The problem has not changed since onset.Pertinent negatives include no abdominal pain. Nothing aggravates the symptoms.    History reviewed. No pertinent past medical history.  There are no active problems to display for this patient.   History reviewed. No pertinent surgical history.     Home Medications    Prior to Admission medications   Medication Sig Start Date End Date Taking? Authorizing Provider  metroNIDAZOLE (FLAGYL) 500 MG tablet Take 1 tablet (500 mg total) by mouth once. Take 4 tablets one time. 03/26/15   Doug SouSam Jacubowitz, MD  oxyCODONE-acetaminophen (PERCOCET/ROXICET) 5-325 MG per tablet Take 2 tablets by mouth every 4 (four) hours as needed for severe pain. 04/23/15   Catha GosselinHanna Patel-Mills, PA-C    Family History No family history on file.  Social History Social History  Substance Use Topics  . Smoking status: Never Smoker  . Smokeless tobacco: Never Used  . Alcohol use Yes     Comment: weekends     Allergies   Review of patient's allergies indicates no known allergies.   Review of Systems Review of Systems  Gastrointestinal: Negative.  Negative for abdominal pain.  Genitourinary: Negative for discharge, dysuria, frequency, genital sores, hematuria, penile pain and urgency.     Physical Exam Triage Vital Signs ED Triage Vitals [05/29/16 1605]  Enc Vitals Group     BP 117/55     Pulse Rate 77     Resp 12     Temp 98.6 F (37 C)     Temp Source Oral     SpO2 100 %     Weight      Height      Head Circumference    Peak Flow      Pain Score      Pain Loc      Pain Edu?      Excl. in GC?    No data found.   Updated Vital Signs BP 117/55 (BP Location: Left Arm)   Pulse 77   Temp 98.6 F (37 C) (Oral)   Resp 12   SpO2 100%   Visual Acuity Right Eye Distance:   Left Eye Distance:   Bilateral Distance:    Right Eye Near:   Left Eye Near:    Bilateral Near:     Physical Exam  Constitutional: He appears well-developed and well-nourished. No distress.  Abdominal: Soft. Bowel sounds are normal.  Genitourinary: Penis normal.  Nursing note and vitals reviewed.    UC Treatments / Results  Labs (all labs ordered are listed, but only abnormal results are displayed) Labs Reviewed  URINE CYTOLOGY ANCILLARY ONLY    EKG  EKG Interpretation None       Radiology No results found.  Procedures Procedures (including critical care time)  Medications Ordered in UC Medications - No data to display   Initial Impression / Assessment and Plan / UC Course  I have reviewed the triage vital signs and the nursing  notes.  Pertinent labs & imaging results that were available during my care of the patient were reviewed by me and considered in my medical decision making (see chart for details).  Clinical Course      Final Clinical Impressions(s) / UC Diagnoses   Final diagnoses:  None    New Prescriptions New Prescriptions   No medications on file     Linna Hoff, MD 05/29/16 [phone]

## 2016-05-29 NOTE — ED Triage Notes (Addendum)
"  might have an std"received a call from a partner saying their doctor says they may have an std

## 2016-05-29 NOTE — Discharge Instructions (Signed)
We will call with test results and treat as indicated °

## 2016-05-30 LAB — URINE CYTOLOGY ANCILLARY ONLY
Chlamydia: NEGATIVE
Neisseria Gonorrhea: NEGATIVE
Trichomonas: NEGATIVE

## 2016-05-30 LAB — HIV ANTIBODY (ROUTINE TESTING W REFLEX): HIV Screen 4th Generation wRfx: NONREACTIVE

## 2016-06-02 ENCOUNTER — Telehealth (HOSPITAL_COMMUNITY): Payer: Self-pay | Admitting: Emergency Medicine

## 2016-06-02 LAB — URINE CYTOLOGY ANCILLARY ONLY: BACTERIAL VAGINITIS: NEGATIVE

## 2016-06-02 NOTE — Telephone Encounter (Signed)
Pt called needing lab results from visit 10/19 Notified of recent lab results  Pt ID'd properly... Reports feeling fine Adv pt if sx are not getting better to return or to f/u w/PCP Education on safe sex given Pt verb understanding.

## 2017-03-18 ENCOUNTER — Encounter (HOSPITAL_BASED_OUTPATIENT_CLINIC_OR_DEPARTMENT_OTHER): Payer: Self-pay | Admitting: *Deleted

## 2017-03-18 ENCOUNTER — Emergency Department (HOSPITAL_BASED_OUTPATIENT_CLINIC_OR_DEPARTMENT_OTHER)
Admission: EM | Admit: 2017-03-18 | Discharge: 2017-03-18 | Disposition: A | Discharge status: Home / Self Care | Payer: Self-pay | Attending: Emergency Medicine | Admitting: Emergency Medicine

## 2017-03-18 DIAGNOSIS — Z113 Encounter for screening for infections with a predominantly sexual mode of transmission: Secondary | ICD-10-CM | POA: Insufficient documentation

## 2017-03-18 DIAGNOSIS — Z5321 Procedure and treatment not carried out due to patient leaving prior to being seen by health care provider: Secondary | ICD-10-CM | POA: Insufficient documentation

## 2017-03-18 NOTE — ED Notes (Signed)
Attempted to update pt vitals, pt did not answer at this time.

## 2017-03-18 NOTE — ED Triage Notes (Signed)
Sore throat x 3 days.  Requesting to be checked for STDs

## 2017-03-18 NOTE — ED Notes (Signed)
Pt called to treatment room x 2 with no answer

## 2017-03-27 ENCOUNTER — Encounter (HOSPITAL_COMMUNITY): Payer: Self-pay | Admitting: Family Medicine

## 2017-03-27 ENCOUNTER — Ambulatory Visit (HOSPITAL_COMMUNITY)
Admission: EM | Admit: 2017-03-27 | Discharge: 2017-03-27 | Disposition: A | Discharge status: Home / Self Care | Payer: Self-pay | Attending: Family Medicine | Admitting: Family Medicine

## 2017-03-27 DIAGNOSIS — N4889 Other specified disorders of penis: Secondary | ICD-10-CM

## 2017-03-27 DIAGNOSIS — R300 Dysuria: Secondary | ICD-10-CM

## 2017-03-27 DIAGNOSIS — Z202 Contact with and (suspected) exposure to infections with a predominantly sexual mode of transmission: Secondary | ICD-10-CM

## 2017-03-27 DIAGNOSIS — Z79899 Other long term (current) drug therapy: Secondary | ICD-10-CM | POA: Insufficient documentation

## 2017-03-27 LAB — POCT URINALYSIS DIP (DEVICE)
Bilirubin Urine: NEGATIVE
Glucose, UA: NEGATIVE mg/dL
HGB URINE DIPSTICK: NEGATIVE
KETONES UR: NEGATIVE mg/dL
Leukocytes, UA: NEGATIVE
Nitrite: NEGATIVE
PH: 6 (ref 5.0–8.0)
PROTEIN: NEGATIVE mg/dL
Specific Gravity, Urine: >=1.03 (ref 1.005–1.030)
Urobilinogen, UA: 0.2 mg/dL (ref 0.0–1.0)

## 2017-03-27 MED ORDER — CEFTRIAXONE SODIUM 250 MG IJ SOLR
250.0000 mg | Freq: Once | INTRAMUSCULAR | Status: AC
  Administered 2017-03-27: 250 mg via INTRAMUSCULAR

## 2017-03-27 MED ORDER — AZITHROMYCIN 250 MG PO TABS
1000.0000 mg | ORAL_TABLET | Freq: Once | ORAL | Status: AC
  Administered 2017-03-27: 1000 mg via ORAL

## 2017-03-27 MED ORDER — AZITHROMYCIN 250 MG PO TABS
ORAL_TABLET | ORAL | Status: AC
  Filled 2017-03-27: qty 1

## 2017-03-27 MED ORDER — STERILE WATER FOR INJECTION IJ SOLN
INTRAMUSCULAR | Status: AC
  Filled 2017-03-27: qty 10

## 2017-03-27 MED ORDER — CEFTRIAXONE SODIUM 250 MG IJ SOLR
INTRAMUSCULAR | Status: AC
  Filled 2017-03-27: qty 250

## 2017-03-27 MED ORDER — METRONIDAZOLE 500 MG PO TABS: 500.0000 mg | ORAL_TABLET | Freq: Two times a day (BID) | ORAL | 0 refills | Status: DC

## 2017-03-27 NOTE — Discharge Instructions (Signed)
We are unable to make a clear-cut diagnosis at this point. Because your contact was treated for STD, we are doing the same for you. Generally the tests require at least 1 day to be completed. Therefore somewhat confusing well at the doctor's office.  The medicine that we are prescribing here in the office chlamydia and gonorrhea. We are sending a prescription to the pharmacy for Flagyl, also called metronidazole, and this would treat another STD known as trichomonas.you should not drink alcohol while taking this medicine.

## 2017-03-27 NOTE — ED Triage Notes (Signed)
PT has been having unprotected sex with a new partner for 1 week. Partner found out today she has an STD. PT reports it feels "weird" when he pees for 2 days.

## 2017-03-27 NOTE — ED Provider Notes (Signed)
[facility]    CSN: [id number] Arrival date & time: 03/27/17  1137     History   Chief Complaint Chief Complaint  Patient presents with  . Exposure to STD    HPI George Thompson is a 31 y.o. male.   This a 31 year old man who comes in seeking evaluation for sexual transmitted diseases. He states that he's been having intercourse with a woman who 6 months pregnant for the past week. She went to see her doctor today and was diagnosed with an unknown STD and given pills and a shot. No diagnosis has been forthcoming from his contact.   He does not have any symptoms other than an unusual feeling in his penis after he voids.      History reviewed. No pertinent past medical history.  There are no active problems to display for this patient.   History reviewed. No pertinent surgical history.     Home Medications    Prior to Admission medications   Medication Sig Start Date End Date Taking? Authorizing Provider  metroNIDAZOLE (FLAGYL) 500 MG tablet Take 1 tablet (500 mg total) by mouth 2 (two) times daily. 03/27/17   Elvina Sidle, MD    Family History No family history on file.  Social History Social History  Substance Use Topics  . Smoking status: Never Smoker  . Smokeless tobacco: Never Used  . Alcohol use Yes     Comment: weekends     Allergies   Patient has no known allergies.   Review of Systems Review of Systems  Genitourinary: Positive for penile pain. Negative for discharge.  All other systems reviewed and are negative.    Physical Exam Triage Vital Signs ED Triage Vitals  Enc Vitals Group     BP      Pulse      Resp      Temp      Temp src      SpO2      Weight      Height      Head Circumference      Peak Flow      Pain Score      Pain Loc      Pain Edu?      Excl. in GC?    No data found.   Updated Vital Signs BP 111/73   Pulse (!) 50   Temp 98.4 F (36.9 C) (Oral)   Resp 16   Ht 5\' 10"  (1.778 m)   Wt 160  lb (72.6 kg)   SpO2 98%   BMI 22.96 kg/m   Visual Acuity Right Eye Distance:   Left Eye Distance:   Bilateral Distance:    Right Eye Near:   Left Eye Near:    Bilateral Near:     Physical Exam  Constitutional: He is oriented to person, place, and time. He appears well-developed and well-nourished.  HENT:  Head: Normocephalic and atraumatic.  Eyes: Pupils are equal, round, and reactive to light. Conjunctivae are normal.  Neck: Normal range of motion.  Pulmonary/Chest: Effort normal.  Genitourinary: Penis normal.  Musculoskeletal: Normal range of motion.  Neurological: He is alert and oriented to person, place, and time.  Skin: Skin is warm and dry.  Nursing note and vitals reviewed.    UC Treatments / Results  Labs (all labs ordered are listed, but only abnormal results are displayed) Labs Reviewed  URINE CYTOLOGY ANCILLARY ONLY    EKG  EKG Interpretation None  Radiology No results found.  Procedures Procedures (including critical care time)  Medications Ordered in UC Medications  azithromycin (ZITHROMAX) tablet 1,000 mg (not administered)  cefTRIAXone (ROCEPHIN) injection 250 mg (not administered)     Initial Impression / Assessment and Plan / UC Course  I have reviewed the triage vital signs and the nursing notes.  Pertinent labs & imaging results that were available during my care of the patient were reviewed by me and considered in my medical decision making (see chart for details).     Final Clinical Impressions(s) / UC Diagnoses   Final diagnoses:  STD exposure    New Prescriptions New Prescriptions   METRONIDAZOLE (FLAGYL) 500 MG TABLET    Take 1 tablet (500 mg total) by mouth 2 (two) times daily.     Controlled Substance Prescriptions [place] Controlled Substance Registry consulted? Not Applicable   Elvina Sidle, MD 03/27/17 1217

## 2017-03-30 LAB — URINE CYTOLOGY ANCILLARY ONLY
Chlamydia: NEGATIVE
Neisseria Gonorrhea: NEGATIVE
Trichomonas: NEGATIVE

## 2018-05-28 ENCOUNTER — Other Ambulatory Visit: Payer: Self-pay

## 2018-05-28 ENCOUNTER — Emergency Department (HOSPITAL_COMMUNITY): Payer: No Typology Code available for payment source

## 2018-05-28 ENCOUNTER — Observation Stay (HOSPITAL_COMMUNITY)
Admission: EM | Admit: 2018-05-28 | Discharge: 2018-05-30 | Disposition: A | Discharge status: Home / Self Care | Payer: No Typology Code available for payment source | Attending: Surgery | Admitting: Surgery

## 2018-05-28 ENCOUNTER — Encounter (HOSPITAL_COMMUNITY): Payer: Self-pay | Admitting: Emergency Medicine

## 2018-05-28 ENCOUNTER — Observation Stay (HOSPITAL_COMMUNITY): Payer: No Typology Code available for payment source

## 2018-05-28 DIAGNOSIS — M545 Low back pain: Secondary | ICD-10-CM | POA: Insufficient documentation

## 2018-05-28 DIAGNOSIS — R51 Headache: Secondary | ICD-10-CM | POA: Diagnosis not present

## 2018-05-28 DIAGNOSIS — M79675 Pain in left toe(s): Secondary | ICD-10-CM

## 2018-05-28 DIAGNOSIS — R1030 Lower abdominal pain, unspecified: Secondary | ICD-10-CM | POA: Diagnosis not present

## 2018-05-28 DIAGNOSIS — M542 Cervicalgia: Secondary | ICD-10-CM | POA: Insufficient documentation

## 2018-05-28 DIAGNOSIS — S301XXA Contusion of abdominal wall, initial encounter: Secondary | ICD-10-CM

## 2018-05-28 DIAGNOSIS — R102 Pelvic and perineal pain: Secondary | ICD-10-CM | POA: Diagnosis not present

## 2018-05-28 DIAGNOSIS — S060X9A Concussion with loss of consciousness of unspecified duration, initial encounter: Secondary | ICD-10-CM

## 2018-05-28 DIAGNOSIS — R109 Unspecified abdominal pain: Secondary | ICD-10-CM | POA: Diagnosis present

## 2018-05-28 DIAGNOSIS — S060XAA Concussion with loss of consciousness status unknown, initial encounter: Secondary | ICD-10-CM

## 2018-05-28 HISTORY — DX: Concussion with loss of consciousness of unspecified duration, initial encounter: S06.0X9A

## 2018-05-28 HISTORY — DX: Concussion with loss of consciousness status unknown, initial encounter: S06.0XAA

## 2018-05-28 LAB — COMPREHENSIVE METABOLIC PANEL
ALBUMIN: 4.3 g/dL (ref 3.5–5.0)
ALT: 29 U/L (ref 0–44)
ANION GAP: 8 (ref 5–15)
AST: 27 U/L (ref 15–41)
Alkaline Phosphatase: 54 U/L (ref 38–126)
BUN: 10 mg/dL (ref 6–20)
CALCIUM: 9.2 mg/dL (ref 8.9–10.3)
CHLORIDE: 107 mmol/L (ref 98–111)
CO2: 23 mmol/L (ref 22–32)
Creatinine, Ser: 0.97 mg/dL (ref 0.61–1.24)
GFR calc non Af Amer: >60 mL/min (ref >60)
GLUCOSE: 85 mg/dL (ref 70–99)
POTASSIUM: 3.4 mmol/L — AB (ref 3.5–5.1)
Sodium: 138 mmol/L (ref 135–145)
Total Bilirubin: 0.8 mg/dL (ref 0.3–1.2)
Total Protein: 7 g/dL (ref 6.5–8.1)

## 2018-05-28 LAB — CBC WITH DIFFERENTIAL/PLATELET
Abs Immature Granulocytes: 0.02 10*3/uL (ref 0.00–0.07)
BASOS ABS: 0 10*3/uL (ref 0.0–0.1)
BASOS PCT: 1 %
EOS PCT: 0 %
Eosinophils Absolute: 0 10*3/uL (ref 0.0–0.5)
HCT: 42.1 % (ref 39.0–52.0)
Hemoglobin: 13.8 g/dL (ref 13.0–17.0)
Immature Granulocytes: 0 %
Lymphocytes Relative: 30 %
Lymphs Abs: 1.8 10*3/uL (ref 0.7–4.0)
MCH: 27.2 pg (ref 26.0–34.0)
MCHC: 32.8 g/dL (ref 30.0–36.0)
MCV: 83 fL (ref 80.0–100.0)
MONO ABS: 0.6 10*3/uL (ref 0.1–1.0)
Monocytes Relative: 10 %
NRBC: 0 % (ref 0.0–0.2)
Neutro Abs: 3.4 10*3/uL (ref 1.7–7.7)
Neutrophils Relative %: 59 %
PLATELETS: 305 10*3/uL (ref 150–400)
RBC: 5.07 MIL/uL (ref 4.22–5.81)
RDW: 12.8 % (ref 11.5–15.5)
WBC: 5.8 10*3/uL (ref 4.0–10.5)

## 2018-05-28 LAB — LACTIC ACID, PLASMA: Lactic Acid, Venous: 0.9 mmol/L (ref 0.5–1.9)

## 2018-05-28 MED ORDER — HYDROMORPHONE HCL 1 MG/ML IJ SOLN
0.5000 mg | INTRAMUSCULAR | Status: DC | PRN
  Administered 2018-05-28 – 2018-05-29 (×2): 0.5 mg via INTRAVENOUS
  Filled 2018-05-28 (×2): qty 1

## 2018-05-28 MED ORDER — GABAPENTIN 300 MG PO CAPS
300.0000 mg | ORAL_CAPSULE | Freq: Three times a day (TID) | ORAL | Status: DC
  Administered 2018-05-29 (×3): 300 mg via ORAL
  Filled 2018-05-28 (×5): qty 1

## 2018-05-28 MED ORDER — METOPROLOL TARTRATE 5 MG/5ML IV SOLN: 5.0000 mg | Freq: Four times a day (QID) | INTRAVENOUS | Status: DC | PRN

## 2018-05-28 MED ORDER — ACETAMINOPHEN 325 MG PO TABS: 650.0000 mg | ORAL_TABLET | ORAL | Status: DC | PRN

## 2018-05-28 MED ORDER — DOCUSATE SODIUM 100 MG PO CAPS
100.0000 mg | ORAL_CAPSULE | Freq: Two times a day (BID) | ORAL | Status: DC
  Administered 2018-05-29 (×2): 100 mg via ORAL
  Filled 2018-05-28 (×4): qty 1

## 2018-05-28 MED ORDER — FENTANYL CITRATE (PF) 100 MCG/2ML IJ SOLN
100.0000 ug | Freq: Once | INTRAMUSCULAR | Status: AC
  Administered 2018-05-28: 100 ug via INTRAVENOUS
  Filled 2018-05-28: qty 2

## 2018-05-28 MED ORDER — IBUPROFEN 600 MG PO TABS
600.0000 mg | ORAL_TABLET | Freq: Four times a day (QID) | ORAL | Status: DC | PRN
  Administered 2018-05-29: 600 mg via ORAL
  Filled 2018-05-28 (×2): qty 1

## 2018-05-28 MED ORDER — TRAMADOL HCL 50 MG PO TABS
50.0000 mg | ORAL_TABLET | Freq: Four times a day (QID) | ORAL | Status: DC | PRN
  Administered 2018-05-29: 50 mg via ORAL
  Filled 2018-05-28: qty 1

## 2018-05-28 MED ORDER — SODIUM CHLORIDE 0.9 % IV SOLN
INTRAVENOUS | Status: DC
  Administered 2018-05-28 – 2018-05-29 (×3): via INTRAVENOUS

## 2018-05-28 MED ORDER — ONDANSETRON 4 MG PO TBDP: 4.0000 mg | ORAL_TABLET | Freq: Four times a day (QID) | ORAL | Status: DC | PRN

## 2018-05-28 MED ORDER — HYDRALAZINE HCL 20 MG/ML IJ SOLN: 10.0000 mg | INTRAMUSCULAR | Status: DC | PRN

## 2018-05-28 MED ORDER — METHOCARBAMOL 500 MG PO TABS
500.0000 mg | ORAL_TABLET | Freq: Four times a day (QID) | ORAL | Status: DC | PRN
  Filled 2018-05-28: qty 1

## 2018-05-28 MED ORDER — IOHEXOL 300 MG/ML  SOLN
100.0000 mL | Freq: Once | INTRAMUSCULAR | Status: AC | PRN
  Administered 2018-05-28: 100 mL via INTRAVENOUS

## 2018-05-28 MED ORDER — ONDANSETRON HCL 4 MG/2ML IJ SOLN
4.0000 mg | Freq: Four times a day (QID) | INTRAMUSCULAR | Status: DC | PRN
  Administered 2018-05-28: 4 mg via INTRAVENOUS
  Filled 2018-05-28: qty 2

## 2018-05-28 MED ORDER — ENOXAPARIN SODIUM 40 MG/0.4ML ~~LOC~~ SOLN
40.0000 mg | SUBCUTANEOUS | Status: DC
  Administered 2018-05-28 – 2018-05-29 (×2): 40 mg via SUBCUTANEOUS
  Filled 2018-05-28 (×2): qty 0.4

## 2018-05-28 NOTE — ED Notes (Signed)
Patient transported to X-ray 
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
## 2018-05-28 NOTE — ED Notes (Signed)
Patient transported to CT 
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
## 2018-05-28 NOTE — ED Notes (Signed)
Patient transported to MRI 

## 2018-05-28 NOTE — ED Notes (Signed)
[employer] at bedside 
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
## 2018-05-29 LAB — HIV ANTIBODY (ROUTINE TESTING W REFLEX): HIV Screen 4th Generation wRfx: NONREACTIVE

## 2018-05-29 LAB — BASIC METABOLIC PANEL
Anion gap: 9 (ref 5–15)
BUN: 9 mg/dL (ref 6–20)
CALCIUM: 9.2 mg/dL (ref 8.9–10.3)
CHLORIDE: 104 mmol/L (ref 98–111)
CO2: 25 mmol/L (ref 22–32)
CREATININE: 0.94 mg/dL (ref 0.61–1.24)
GFR calc Af Amer: >60 mL/min (ref >60)
GFR calc non Af Amer: >60 mL/min (ref >60)
GLUCOSE: 96 mg/dL (ref 70–99)
Potassium: 3.5 mmol/L (ref 3.5–5.1)
Sodium: 138 mmol/L (ref 135–145)

## 2018-05-29 LAB — CBC
HEMATOCRIT: 41.9 % (ref 39.0–52.0)
HEMOGLOBIN: 13.9 g/dL (ref 13.0–17.0)
MCH: 27.3 pg (ref 26.0–34.0)
MCHC: 33.2 g/dL (ref 30.0–36.0)
MCV: 82.2 fL (ref 80.0–100.0)
Platelets: 312 10*3/uL (ref 150–400)
RBC: 5.1 MIL/uL (ref 4.22–5.81)
RDW: 12.6 % (ref 11.5–15.5)
WBC: 5.7 10*3/uL (ref 4.0–10.5)
nRBC: 0 % (ref 0.0–0.2)
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
## 2018-05-29 NOTE — Progress Notes (Signed)
Pt. transported from ER via stretcher to 6N-09; alert and oriented x4; pt. moved self over to bed. C/O's mild pain around (R) hip/pelvis area; didn't like the way the Dilaudid made him feel. Oriented pt. to call button and room.

## 2018-05-29 NOTE — Plan of Care (Signed)
  Problem: Education: Goal: Knowledge of General Education information will improve Description Including pain rating scale, medication(s)/side effects and non-pharmacologic comfort measures Outcome: Progressing Note:  POC and orders reviewed with pt.   

## 2018-05-30 LAB — CBC
HEMATOCRIT: 41.1 % (ref 39.0–52.0)
Hemoglobin: 13.2 g/dL (ref 13.0–17.0)
MCH: 26.4 pg (ref 26.0–34.0)
MCHC: 32.1 g/dL (ref 30.0–36.0)
MCV: 82.2 fL (ref 80.0–100.0)
NRBC: 0 % (ref 0.0–0.2)
Platelets: 286 10*3/uL (ref 150–400)
RBC: 5 MIL/uL (ref 4.22–5.81)
RDW: 12.4 % (ref 11.5–15.5)
WBC: 6.3 10*3/uL (ref 4.0–10.5)

## 2018-05-30 MED ORDER — TRAMADOL HCL 50 MG PO TABS: 50.0000 mg | ORAL_TABLET | Freq: Four times a day (QID) | ORAL | 1 refills | Status: AC | PRN
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
## 2018-05-30 NOTE — Progress Notes (Signed)
George Thompson to be D/C'd  per MD order. Discussed with the patient and all questions fully answered.  VSS, Skin clean, dry and intact without evidence of skin break down, no evidence of skin tears noted.  IV catheter discontinued intact. Site without signs and symptoms of complications. Dressing and pressure applied.  An After Visit Summary was printed and given to the patient. Patient received prescription.  D/c education completed with patient/family including follow up instructions, medication list, d/c activities limitations if indicated, with other d/c instructions as indicated by MD - patient able to verbalize understanding, all questions fully answered.   Patient instructed to return to ED, call 911, or call MD for any changes in condition.   Patient to be escorted via WC, and D/C home via private auto.
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
## 2023-09-28 ENCOUNTER — Emergency Department (HOSPITAL_BASED_OUTPATIENT_CLINIC_OR_DEPARTMENT_OTHER)
Admission: EM | Admit: 2023-09-28 | Discharge: 2023-09-28 | Disposition: A | Discharge status: Home / Self Care | Payer: Self-pay | Attending: Emergency Medicine | Admitting: Emergency Medicine

## 2023-09-28 ENCOUNTER — Encounter (HOSPITAL_BASED_OUTPATIENT_CLINIC_OR_DEPARTMENT_OTHER): Payer: Self-pay

## 2023-09-28 ENCOUNTER — Other Ambulatory Visit: Payer: Self-pay

## 2023-09-28 DIAGNOSIS — S80862A Insect bite (nonvenomous), left lower leg, initial encounter: Secondary | ICD-10-CM | POA: Insufficient documentation

## 2023-09-28 DIAGNOSIS — S80861A Insect bite (nonvenomous), right lower leg, initial encounter: Secondary | ICD-10-CM | POA: Insufficient documentation

## 2023-09-28 DIAGNOSIS — W57XXXA Bitten or stung by nonvenomous insect and other nonvenomous arthropods, initial encounter: Secondary | ICD-10-CM | POA: Insufficient documentation

## 2023-09-28 MED ORDER — HYDROXYZINE HCL 25 MG PO TABS: 25.0000 mg | ORAL_TABLET | Freq: Four times a day (QID) | ORAL | 0 refills | Status: AC
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
## 2023-09-28 NOTE — Discharge Instructions (Signed)
You were seen in the emergency department today for concerns of a rash.  I suspect you did likely sustain a bug bite on your trip in [place].  I sent a prescription for Atarax to your pharmacy to help with some of the itching.  May continue to apply topical medications such as hydrocortisone and calamine to help reduce some of the discomfort.  If any infection were to arise such as significant redness, drainage, or development of a fever, return to the emergency department or seek medical evaluation as this may require antibiotic therapy.  For any new or worsening symptoms, return to the ER.

## 2023-09-28 NOTE — ED Triage Notes (Signed)
Pt reports that he was in [place] over the weekend and now he has bites all over his body. Reports that he he may have got into some bed bugs. States that bites are itching. Says cream is not working.
